# Patient Record
Sex: Male | Born: 2005 | Race: White | Hispanic: Yes | Marital: Single | Smoking: Never smoker
Health system: Southern US, Community
[De-identification: ages and names within clinical notes are randomized; demographics above are authoritative.]

## PROBLEM LIST (undated history)

## (undated) DIAGNOSIS — F4322 Adjustment disorder with anxiety: Principal | ICD-10-CM

## (undated) DIAGNOSIS — R51 Headache: Secondary | ICD-10-CM

## (undated) HISTORY — DX: Headache: R51

## (undated) HISTORY — DX: Adjustment disorder with anxiety: F43.22

---
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
## 2023-02-18 ENCOUNTER — Telehealth: Payer: Self-pay | Admitting: Family

## 2023-02-18 NOTE — Telephone Encounter (Signed)
Called Pt and left a voicemail to remind of scheduled New Patient appt. 

## 2023-02-22 NOTE — Progress Notes (Unsigned)
  Subjective:    Alan Waters - 17 y.o. male MRN [id number]  Date of birth: July 08, 2006  HPI  Alan Waters is to establish care.  Current issues and/or concerns:  ROS per HPI     Health Maintenance:  Health Maintenance Due  Topic Date Due   Hepatitis C Screening  Never done   COVID-19 Vaccine (2 - 2023-24 season) 05/18/2022     Past Medical History: Patient Active Problem List   Diagnosis Date Noted   Keratosis pilaris 08/29/2020   Astigmatism, regular 02/28/2015   Generalized anxiety disorder 11/29/2014   Reading disorder 11/29/2014      Social History   reports that he has never smoked. He has never used smokeless tobacco. He reports that he does not drink alcohol and does not use drugs.   Family History  family history includes Diabetes in his paternal uncle.   Medications: reviewed and updated   Objective:   Physical Exam There were no vitals taken for this visit. Physical Exam      Assessment & Plan:         Patient was given clear instructions to go to Emergency Department or return to medical center if symptoms don't improve, worsen, or new problems develop.The patient verbalized understanding.  I discussed the assessment and treatment plan with the patient. The patient was provided an opportunity to ask questions and all were answered. The patient agreed with the plan and demonstrated an understanding of the instructions.   The patient was advised to call back or seek an in-person evaluation if the symptoms worsen or if the condition fails to improve as anticipated.    Ricky Stabs, NP 02/22/2023, 1:05 PM Primary Care at [facility]

## 2023-02-26 ENCOUNTER — Ambulatory Visit (INDEPENDENT_AMBULATORY_CARE_PROVIDER_SITE_OTHER): Payer: Medicaid Other | Admitting: Family

## 2023-02-26 ENCOUNTER — Encounter: Payer: Self-pay | Admitting: Family

## 2023-02-26 VITALS — BP 110/71 | HR 85 | Temp 98.1°F | Resp 12 | Ht 64.0 in | Wt 127.4 lb

## 2023-02-26 DIAGNOSIS — Z130 Encounter for screening for diseases of the blood and blood-forming organs and certain disorders involving the immune mechanism: Secondary | ICD-10-CM

## 2023-02-26 DIAGNOSIS — Z1329 Encounter for screening for other suspected endocrine disorder: Secondary | ICD-10-CM

## 2023-02-26 DIAGNOSIS — Z13228 Encounter for screening for other metabolic disorders: Secondary | ICD-10-CM

## 2023-02-26 DIAGNOSIS — Z13220 Encounter for screening for lipoid disorders: Secondary | ICD-10-CM

## 2023-02-26 DIAGNOSIS — Z0000 Encounter for general adult medical examination without abnormal findings: Secondary | ICD-10-CM

## 2023-02-26 DIAGNOSIS — Z131 Encounter for screening for diabetes mellitus: Secondary | ICD-10-CM

## 2023-02-26 DIAGNOSIS — Z758 Other problems related to medical facilities and other health care: Secondary | ICD-10-CM

## 2023-02-26 DIAGNOSIS — Z1159 Encounter for screening for other viral diseases: Secondary | ICD-10-CM

## 2023-02-26 DIAGNOSIS — Z7689 Persons encountering health services in other specified circumstances: Secondary | ICD-10-CM

## 2023-02-26 NOTE — Patient Instructions (Signed)
Thank you for choosing Primary Care at [facility] for your medical home!    Alan Waters was seen by Rema Fendt, NP today.   Drexel Iha Rodriguez's primary care provider is Rema Fendt, NP.   For the best care possible,  you should try to see Ricky Stabs, NP whenever you come to office.   We look forward to seeing you again soon!  If you have any questions about your visit today,  please call us at [phone]  Or feel free to reach your provider via MyChart.      Keeping you healthy   Get these tests Blood pressure- Have your blood pressure checked once a year by your healthcare provider.  Normal blood pressure is 120/80. Weight- Have your body mass index (BMI) calculated to screen for obesity.  BMI is a measure of body fat based on height and weight. You can also calculate your own BMI at https://www.west-esparza.com/. Cholesterol- Have your cholesterol checked regularly starting at age 83, sooner may be necessary if you have diabetes, high blood pressure, if a family member developed heart diseases at an early age or if you smoke.  Chlamydia, HIV, and other sexual transmitted disease- Get screened each year until the age of 52 then within three months of each new sexual partner. Diabetes- Have your blood sugar checked regularly if you have high blood pressure, high cholesterol, a family history of diabetes or if you are overweight.   Get these vaccines Flu shot- Every fall. Tetanus shot- Every 10 years. Menactra- Single dose; prevents meningitis.   Take these steps Don't smoke- If you do smoke, ask your healthcare provider about quitting. For tips on how to quit, go to www.smokefree.gov or call 1-800-QUIT-NOW. Be physically active- Exercise 5 days a week for at least 30 minutes.  If you are not already physically active start slow and gradually work up to 30 minutes of moderate physical activity.  Examples of moderate activity include walking briskly, mowing  the yard, dancing, swimming bicycling, etc. Eat a healthy diet- Eat a variety of healthy foods such as fruits, vegetables, low fat milk, low fat cheese, yogurt, lean meats, poultry, fish, beans, tofu, etc.  For more information on healthy eating, go to www.thenutritionsource.org Drink alcohol in moderation- Limit alcohol intake two drinks or less a day.  Never drink and drive. Dentist- Brush and floss teeth twice daily; visit your dentis twice a year. Depression-Your emotional health is as important as your physical health.  If you're feeling down, losing interest in things you normally enjoy please talk with your healthcare provider. Gun Safety- If you keep a gun in your home, keep it unloaded and with the safety lock on.  Bullets should be stored separately. Helmet use- Always wear a helmet when riding a motorcycle, bicycle, rollerblading or skateboarding. Safe sex- If you may be exposed to a sexually transmitted infection, use a condom Seat belts- Seat bels can save your life; always wear one. Smoke/Carbon Monoxide detectors- These detectors need to be installed on the appropriate level of your home.  Replace batteries at least once a year. Skin Cancer- When out in the sun, cover up and use sunscreen SPF 15 or higher. Violence- If anyone is threatening or hurting you, please tell your healthcare provider.

## 2023-02-27 ENCOUNTER — Other Ambulatory Visit: Payer: Self-pay | Admitting: Family

## 2023-02-27 DIAGNOSIS — Z13228 Encounter for screening for other metabolic disorders: Secondary | ICD-10-CM

## 2023-02-27 DIAGNOSIS — R7303 Prediabetes: Secondary | ICD-10-CM | POA: Insufficient documentation

## 2023-02-27 LAB — CBC
Hematocrit: 46.9 % (ref 37.5–51.0)
Hemoglobin: 15.5 g/dL (ref 13.0–17.7)
MCH: 28.5 pg (ref 26.6–33.0)
MCHC: 33 g/dL (ref 31.5–35.7)
MCV: 86 fL (ref 79–97)
Platelets: 261 10*3/uL (ref 150–450)
RBC: 5.43 x10E6/uL (ref 4.14–5.80)
RDW: 13 % (ref 11.6–15.4)
WBC: 5.5 10*3/uL (ref 3.4–10.8)

## 2023-02-27 LAB — CMP14+EGFR
ALT: 50 IU/L — ABNORMAL HIGH (ref 0–44)
AST: 82 IU/L — ABNORMAL HIGH (ref 0–40)
Albumin/Globulin Ratio: 2
Albumin: 5.1 g/dL (ref 4.3–5.2)
Alkaline Phosphatase: 63 IU/L (ref 51–125)
BUN/Creatinine Ratio: 11 (ref 9–20)
BUN: 7 mg/dL (ref 6–20)
Bilirubin Total: 0.7 mg/dL (ref 0.0–1.2)
CO2: 26 mmol/L (ref 20–29)
Calcium: 9.8 mg/dL (ref 8.7–10.2)
Chloride: 99 mmol/L (ref 96–106)
Creatinine, Ser: 0.63 mg/dL — ABNORMAL LOW (ref 0.76–1.27)
Globulin, Total: 2.5 g/dL (ref 1.5–4.5)
Glucose: 77 mg/dL (ref 70–99)
Potassium: 4.4 mmol/L (ref 3.5–5.2)
Sodium: 139 mmol/L (ref 134–144)
Total Protein: 7.6 g/dL (ref 6.0–8.5)
eGFR: 141 mL/min/{1.73_m2} (ref >59)

## 2023-02-27 LAB — TSH: TSH: 0.968 u[IU]/mL (ref 0.450–4.500)

## 2023-02-27 LAB — HEPATITIS C ANTIBODY: Hep C Virus Ab: NONREACTIVE

## 2023-02-27 LAB — LIPID PANEL
Chol/HDL Ratio: 2.9 ratio (ref 0.0–5.0)
Cholesterol, Total: 150 mg/dL (ref 100–169)
HDL: 51 mg/dL (ref >39)
LDL Chol Calc (NIH): 87 mg/dL (ref 0–109)
Triglycerides: 55 mg/dL (ref 0–89)
VLDL Cholesterol Cal: 12 mg/dL (ref 5–40)

## 2023-02-27 LAB — HEMOGLOBIN A1C
Est. average glucose Bld gHb Est-mCnc: 117 mg/dL
Hgb A1c MFr Bld: 5.7 % — ABNORMAL HIGH (ref 4.8–5.6)

## 2023-04-23 ENCOUNTER — Encounter: Payer: Self-pay | Admitting: Family

## 2023-04-23 ENCOUNTER — Ambulatory Visit (INDEPENDENT_AMBULATORY_CARE_PROVIDER_SITE_OTHER): Payer: Medicaid Other | Admitting: Family

## 2023-04-23 VITALS — BP 114/68 | HR 87 | Temp 98.7°F | Ht 64.0 in | Wt 132.8 lb

## 2023-04-23 DIAGNOSIS — Z13228 Encounter for screening for other metabolic disorders: Secondary | ICD-10-CM | POA: Diagnosis not present

## 2023-04-23 NOTE — Progress Notes (Signed)
Patient ID: Alan Waters, male    DOB: 24-Jul-2006  MRN: [id number]  CC: Follow-Up  Subjective: Alan Waters is a 17 y.o. male who presents for follow-up.  His concerns today include:  Recheck liver function. No further issues/concerns for discussion today.   Patient Active Problem List   Diagnosis Date Noted   Prediabetes 02/27/2023   Keratosis pilaris 08/29/2020   Astigmatism, regular 02/28/2015   Generalized anxiety disorder 11/29/2014   Reading disorder 11/29/2014     Current Outpatient Medications on File Prior to Visit  Medication Sig Dispense Refill   Clindamycin-Benzoyl Per, Refr, gel Thin topical layer 1-2 times a day (Patient not taking: Reported on 02/26/2023) 45 g 3   hydrocortisone 2.5 % ointment Apply topically 2 (two) times daily. Apply twice a day to back of arms as needed (Patient not taking: Reported on 02/26/2023) 30 g 3   No current facility-administered medications on file prior to visit.    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 He lives with both parents, his sister, and his two brothers. He enjoys drawing, playing outside, and his phone   Social Determinants of Health   Financial Resource Strain: Not on file  Food Insecurity: Not on file  Transportation Needs: Not on file  Physical Activity: Not on file  Stress: Not on file  Social Connections: Not on file  Intimate  Partner Violence: Not on file    Family History  Problem Relation Age of Onset   Diabetes Paternal Uncle     No past surgical history on file.  ROS: Review of Systems Negative except as stated above  PHYSICAL EXAM: BP 114/68   Pulse 87   Temp 98.7 F (37.1 C) (Oral)   Ht 5\' 4"  (1.626 m)   Wt 132 lb 12.8 oz (60.2 kg)   SpO2 98%   BMI 22.80 kg/m   Physical Exam HENT:     Head: Normocephalic and atraumatic.     Nose: Nose normal.     Mouth/Throat:     Mouth: Mucous membranes are moist.     Pharynx: Oropharynx is clear.  Eyes:     Extraocular Movements: Extraocular movements intact.     Conjunctiva/sclera: Conjunctivae normal.     Pupils: Pupils are equal, round, and reactive to light.  Cardiovascular:     Rate and Rhythm: Normal rate and regular rhythm.     Pulses: Normal pulses.     Heart sounds: Normal heart sounds.  Pulmonary:     Effort: Pulmonary effort is normal.     Breath sounds: Normal breath sounds.  Musculoskeletal:        General: Normal range of motion.     Cervical back: Normal range of motion and neck supple.  Neurological:     General:  No focal deficit present.     Mental Status: He is alert and oriented to person, place, and time.  Psychiatric:        Mood and Affect: Mood normal.        Behavior: Behavior normal.     ASSESSMENT AND PLAN: 1. Screening for metabolic disorder - Routine screening.  - AST - ALT   Patient was given the opportunity to ask questions.  Patient verbalized understanding of the plan and was able to repeat key elements of the plan. Patient was given clear instructions to go to Emergency Department or return to medical center if symptoms don't improve, worsen, or new problems develop.The patient verbalized understanding.   Orders Placed This Encounter  Procedures   AST   ALT    Return in about 4 months (around 08/23/2023) for Follow-Up or next available prediabetes.  Rema Fendt, NP

## 2023-08-23 ENCOUNTER — Ambulatory Visit (INDEPENDENT_AMBULATORY_CARE_PROVIDER_SITE_OTHER): Payer: Medicaid Other | Admitting: Family

## 2023-08-23 ENCOUNTER — Encounter: Payer: Self-pay | Admitting: Family

## 2023-08-23 VITALS — BP 130/77 | HR 87 | Temp 97.5°F | Ht 64.5 in | Wt 138.6 lb

## 2023-08-23 DIAGNOSIS — Z0000 Encounter for general adult medical examination without abnormal findings: Secondary | ICD-10-CM | POA: Diagnosis not present

## 2023-08-23 DIAGNOSIS — Z23 Encounter for immunization: Secondary | ICD-10-CM | POA: Diagnosis not present

## 2023-08-23 NOTE — Progress Notes (Signed)
Patient states no concerns to discuss.   Patient wants meningo b vaccine.

## 2023-08-23 NOTE — Progress Notes (Signed)
Patient ID: Alan Waters, male    DOB: 07-22-2006  MRN: [id number]  CC: Vaccine  Subjective: Alan Waters is a 17 y.o. male who presents for vaccine.   His concerns today include:  No issues/concerns for discussion today.   Patient Active Problem List   Diagnosis Date Noted   Prediabetes 02/27/2023   Keratosis pilaris 08/29/2020   Astigmatism, regular 02/28/2015   Generalized anxiety disorder 11/29/2014   Reading disorder 11/29/2014     No current outpatient medications on file prior to visit.   No current facility-administered medications on file prior to visit.    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 He lives with both parents, his sister, and his two brothers. He enjoys drawing, playing outside, and his phone   Social Determinants of Health   Financial Resource Strain: Not on file  Food Insecurity: Not on file  Transportation Needs: Not on file  Physical Activity: Not on file  Stress: Not on file  Social Connections: Not on file  Intimate Partner Violence: Not on file    Family History  Problem Relation Age of Onset   Diabetes Paternal Uncle     No past surgical history on file.  ROS: Review of Systems Negative except as stated above  PHYSICAL EXAM: BP 130/77   Pulse 87   Temp (!) 97.5 F (36.4 C) (Oral)   Ht 5' 4.5" (1.638 m)   Wt 138 lb 9.6 oz (62.9 kg)   SpO2 98%    BMI 23.42 kg/m   Physical Exam HENT:     Head: Normocephalic and atraumatic.     Nose: Nose normal.     Mouth/Throat:     Mouth: Mucous membranes are moist.     Pharynx: Oropharynx is clear.  Eyes:     Extraocular Movements: Extraocular movements intact.     Conjunctiva/sclera: Conjunctivae normal.     Pupils: Pupils are equal, round, and reactive to light.  Cardiovascular:     Rate and Rhythm: Normal rate and regular rhythm.     Pulses: Normal pulses.     Heart sounds: Normal heart sounds.  Pulmonary:     Effort: Pulmonary effort is normal.     Breath sounds: Normal breath sounds.  Musculoskeletal:        General: Normal range of motion.     Cervical back: Normal range of motion and neck supple.  Neurological:     General: No focal deficit present.     Mental Status: He is alert and oriented to person, place, and time.  Psychiatric:        Mood and Affect: Mood normal.        Behavior: Behavior normal.     ASSESSMENT AND PLAN: 1. Immunization due - Administered.  - Meningococcal  B, OMV   Patient was given the opportunity to ask questions.  Patient verbalized understanding of the plan and was able to repeat key elements of the plan. Patient was given clear instructions to go to Emergency Department or return to medical center if symptoms don't improve, worsen, or new problems develop.The patient verbalized understanding.   Orders Placed This Encounter  Procedures   Meningococcal B, OMV    Follow-up with primary provider as scheduled.   Rema Fendt, NP

## 2023-12-16 ENCOUNTER — Other Ambulatory Visit: Payer: Self-pay

## 2023-12-16 ENCOUNTER — Ambulatory Visit
Admission: EM | Admit: 2023-12-16 | Discharge: 2023-12-16 | Disposition: A | Discharge status: Home / Self Care | Attending: Emergency Medicine | Admitting: Emergency Medicine

## 2023-12-16 ENCOUNTER — Encounter: Payer: Self-pay | Admitting: *Deleted

## 2023-12-16 DIAGNOSIS — R112 Nausea with vomiting, unspecified: Secondary | ICD-10-CM | POA: Insufficient documentation

## 2023-12-16 LAB — POCT INFLUENZA A/B
Influenza A, POC: NEGATIVE
Influenza B, POC: NEGATIVE

## 2023-12-16 MED ORDER — ONDANSETRON HCL 4 MG PO TABS: 4.0000 mg | ORAL_TABLET | Freq: Three times a day (TID) | ORAL | 0 refills | Status: DC | PRN

## 2023-12-16 MED ORDER — ONDANSETRON 4 MG PO TBDP
4.0000 mg | ORAL_TABLET | Freq: Once | ORAL | Status: AC
  Administered 2023-12-16: 4 mg via ORAL

## 2023-12-16 NOTE — Discharge Instructions (Addendum)
 Most likely this is a viral illness and will need to run its course , no antibitoics are indicated.  Your flu test was negative , COVID is pending , check MyChart for results  Rest, push fluids, take Zofran as directed If you are unable to keep any fluids down, or have worsening symptoms go to the emergency room for further evaluation

## 2023-12-16 NOTE — ED Provider Notes (Addendum)
 [facility]    CSN: [id number] Arrival date & time: 12/16/23  1302      History   Chief Complaint Chief Complaint  Patient presents with   Emesis    HPI Alan Waters is a 18 y.o. male.   18 year old male pt, Alan Waters, Alan Waters to urgent care for evaluation of vomiting that started this morning.  Patient states he has vomited 3 times, no diarrhea, patient states he tried to eat some water and eggs and vomited 30 minutes after.  No known illness exposure.   The history is provided by the patient. No language interpreter was used.    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     Headache(784.0)    Migraine variant 08/03/2013    Patient Active Problem List   Diagnosis Date Noted   Prediabetes 02/27/2023   Keratosis pilaris 08/29/2020   Astigmatism, regular 02/28/2015   Generalized anxiety disorder 11/29/2014   Reading disorder 11/29/2014   Nausea and vomiting 08/03/2013    History reviewed. No pertinent surgical history.     Home Medications    Prior to Admission medications   Medication Sig Start Date End Date Taking? Authorizing Provider  ondansetron (ZOFRAN) 4 MG tablet Take 1 tablet (4 mg total) by mouth every 8 (eight) hours as needed for up to 3 days for nausea or vomiting. 12/16/23 12/19/23 Yes Trea Latner, Para March, NP    Family History Family History  Problem Relation Age of Onset   Diabetes Paternal Uncle     Social History Social History   Tobacco Use   Smoking status: Never   Smokeless tobacco: Never  Substance Use Topics   Alcohol use: No    Alcohol/week: 0.0 standard drinks of alcohol   Drug use: No     Allergies   Patient has no known allergies.   Review of Systems Review of Systems  Constitutional:  Negative for fever.  Gastrointestinal:  Positive for  abdominal pain, nausea and vomiting. Negative for diarrhea.  All other systems reviewed and are negative.    Physical Exam Triage Vital Signs ED Triage Vitals  Encounter Vitals Group     BP      Systolic BP Percentile      Diastolic BP Percentile      Pulse      Resp      Temp      Temp src      SpO2      Weight      Height      Head Circumference      Peak Flow      Pain Score      Pain Loc      Pain Education      Exclude from Growth Chart    No data found.  Updated Vital Signs BP 122/63 (BP Location: Left Arm)   Pulse (!) 114   Temp 99.2 F (37.3 C) (Oral)   Resp 18   SpO2 97%   Visual Acuity Right Eye Distance:   Left Eye Distance:   Bilateral Distance:    Right Eye Near:   Left Eye Near:    Bilateral Near:     Physical Exam Vitals and nursing note reviewed.  Constitutional:      General: He is not in acute distress.    Appearance: He  is well-developed and well-groomed.  HENT:     Head: Normocephalic and atraumatic.  Eyes:     Conjunctiva/sclera: Conjunctivae normal.  Cardiovascular:     Rate and Rhythm: Regular rhythm. Tachycardia present.     Heart sounds: Normal heart sounds. No murmur heard. Pulmonary:     Effort: Pulmonary effort is normal. No respiratory distress.     Breath sounds: Normal breath sounds and air entry.  Abdominal:     General: Bowel sounds are increased.     Palpations: Abdomen is soft.     Tenderness: There is no abdominal tenderness. There is no guarding or rebound.  Musculoskeletal:        General: No swelling.     Cervical back: Neck supple.  Skin:    General: Skin is warm and dry.     Capillary Refill: Capillary refill takes less than 2 seconds.  Neurological:     General: No focal deficit present.     Mental Status: He is alert and oriented to person, place, and time.     GCS: GCS eye subscore is 4. GCS verbal subscore is 5. GCS motor subscore is 6.     Cranial Nerves: No cranial nerve deficit.     Sensory: No  sensory deficit.  Psychiatric:        Attention and Perception: Attention normal.        Mood and Affect: Mood normal.        Speech: Speech normal.        Behavior: Behavior normal. Behavior is cooperative.      UC Treatments / Results  Labs (all labs ordered are listed, but only abnormal results are displayed) Labs Reviewed  POCT INFLUENZA A/B - Normal  SARS CORONAVIRUS 2 (TAT 6-24 HRS)    EKG   Radiology No results found.  Procedures Procedures (including critical care time)  Medications Ordered in UC Medications  ondansetron (ZOFRAN-ODT) disintegrating tablet 4 mg (4 mg Oral Given 12/16/23 1547)    Initial Impression / Assessment and Plan / UC Course  I have reviewed the triage vital signs and the nursing notes.  Pertinent labs & imaging results that were available during my care of the patient were reviewed by me and considered in my medical decision making (see chart for details).    Discussed exam findings and plan of care with patient, clear liquid diet , Zofran scripted ,strict go to ER precautions given.   Patient verbalized understanding to this provider.  Ddx: Gastroenteritis, nausea, vomiting, viral illness Final Clinical Impressions(s) / UC Diagnoses   Final diagnoses:  Nausea and vomiting, unspecified vomiting type     Discharge Instructions      Most likely this is a viral illness and will need to run its course , no antibitoics are indicated.  Your flu test was negative , COVID is pending , check MyChart for results  Rest, push fluids, take Zofran as directed If you are unable to keep any fluids down, or have worsening symptoms go to the emergency room for further evaluation     ED Prescriptions     Medication Sig Dispense Auth. Provider   ondansetron (ZOFRAN) 4 MG tablet Take 1 tablet (4 mg total) by mouth every 8 (eight) hours as needed for up to 3 days for nausea or vomiting. 9 tablet Shamarr Faucett, Para March, NP      PDMP not reviewed  this encounter.   Clancy Gourd, NP 12/16/23 1702    Clancy Gourd, NP 12/16/23 [phone]

## 2023-12-16 NOTE — ED Triage Notes (Signed)
 Pt reports vomiting x 3 today, last at 1200. Denies diarrhea, endorses nausea

## 2023-12-16 NOTE — ED Notes (Signed)
 Pt tolerating po fluids. Reports nausea has improved

## 2023-12-17 ENCOUNTER — Ambulatory Visit: Attending: Nurse Practitioner | Admitting: Nurse Practitioner

## 2023-12-17 ENCOUNTER — Encounter: Payer: Self-pay | Admitting: Nurse Practitioner

## 2023-12-17 ENCOUNTER — Ambulatory Visit: Payer: Self-pay

## 2023-12-17 VITALS — BP 107/74 | HR 110 | Temp 98.5°F | Resp 18 | Ht 64.5 in | Wt 145.2 lb

## 2023-12-17 DIAGNOSIS — B349 Viral infection, unspecified: Secondary | ICD-10-CM | POA: Diagnosis not present

## 2023-12-17 LAB — SARS CORONAVIRUS 2 (TAT 6-24 HRS): SARS Coronavirus 2: NEGATIVE

## 2023-12-17 MED ORDER — IBUPROFEN 600 MG PO TABS: 600.0000 mg | ORAL_TABLET | Freq: Three times a day (TID) | ORAL | 0 refills | Status: AC | PRN

## 2023-12-17 MED ORDER — ONDANSETRON 4 MG PO TBDP: 4.0000 mg | ORAL_TABLET | Freq: Three times a day (TID) | ORAL | 0 refills | Status: AC | PRN

## 2023-12-17 NOTE — Telephone Encounter (Addendum)
  Chief Complaint: vomiting Symptoms: headac, eye pain Frequency: yesterday  Pertinent Negatives: Patient denies fever, stiff neck, sore throat, cold sx Disposition: [] ED /[] Urgent Care (no appt availability in office) / [x] Appointment(In office/virtual)/ []  [facility]/ [] Home Care/ [] Refused Recommended Disposition /[] [facility]/ []  Follow-up with PCP Additional Notes: assisted by Spanish translator Morrie Sheldon [phone] Copied from CRM [id number]. Topic: Clinical - Red Word Triage >> Dec 17, 2023  8:17 AM Fredrich Romans wrote: Red Word that prompted transfer to Nurse Triage: vomiting,headache Reason for Disposition  [1] MODERATE headache (e.g., interferes with normal activities) AND [2] present > 24 hours AND [3] unexplained  (Exceptions: analgesics not tried, typical migraine, or headache part of viral illness)  Answer Assessment - Initial Assessment Questions 1. LOCATION: "Where does it hurt?"      Top of head  2. ONSET: "When did the headache start?" (Minutes, hours or days)      Yesterday  3. PATTERN: "Does the pain come and go, or has it been constant since it started?"    Headache constant 4. SEVERITY: "How bad is the pain?" and "What does it keep you from doing?"  (e.g., Scale 1-10; mild, moderate, or severe)   - MILD (1-3): doesn't interfere with normal activities    - MODERATE (4-7): interferes with normal activities or awakens from sleep    - SEVERE (8-10): excruciating pain, unable to do any normal activities        7/10 9. OTHER SYMPTOMS: "Do you have any other symptoms?" (fever, stiff neck, eye pain, sore throat, cold symptoms)     Vomiting 6 yesterday, eyes hurt  Protocols used: Headache-A-AH

## 2023-12-17 NOTE — Progress Notes (Signed)
 Assessment & Plan:   Alan Waters was seen today for headache and generalized body aches.  Diagnoses and all orders for this visit:  Viral illness Headache likely due to dehydration. Increase fluid intake: gatorade, pedialyte popsicles, hydration drinks, water, broth -     ondansetron (ZOFRAN-ODT) 4 MG disintegrating tablet; Take 1 tablet (4 mg total) by mouth every 8 (eight) hours as needed for nausea or vomiting. -     ibuprofen (ADVIL) 600 MG tablet; Take 1 tablet (600 mg total) by mouth every 8 (eight) hours as needed.    Patient has been counseled on age-appropriate routine health concerns for screening and prevention. These are reviewed and up-to-date. Referrals have been placed accordingly. Immunizations are up-to-date or declined.    Subjective:   Chief Complaint  Patient presents with   Headache   Generalized Body Aches    Alan Waters 18 y.o. male presents to office today for viral illness  He is accompanied by his grandmother today and is a patient of Ricky Stabs NP.   He was treated at the urgent care yesterday for vomiting with zofran. Today he states the frequency of vomiting has decreased however his body is sore and he is experiencing a headache. Denies fever, neck pain or change in vision.     Review of Systems  Constitutional:  Negative for fever, malaise/fatigue and weight loss.  HENT: Negative.  Negative for nosebleeds.   Eyes: Negative.  Negative for blurred vision, double vision and photophobia.  Respiratory: Negative.  Negative for cough and shortness of breath.   Cardiovascular: Negative.  Negative for chest pain, palpitations and leg swelling.  Gastrointestinal: Negative.  Negative for heartburn, nausea and vomiting.  Musculoskeletal:  Positive for myalgias.  Neurological:  Positive for headaches. Negative for dizziness, focal weakness and seizures.  Psychiatric/Behavioral: Negative.  Negative for suicidal ideas.     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     Headache(784.0)    Migraine variant 08/03/2013    No past surgical history on file.  Family History  Problem Relation Age of Onset   Diabetes Paternal Uncle     Social History Reviewed with no changes to be made today.   Outpatient Medications Prior to Visit  Medication Sig Dispense Refill   ondansetron (ZOFRAN) 4 MG tablet Take 1 tablet (4 mg total) by mouth every 8 (eight) hours as needed for up to 3 days for nausea or vomiting. 9 tablet 0   No facility-administered medications prior to visit.    No Known Allergies     Objective:    BP 107/74 (BP Location: Left Arm, Patient Position: Sitting, Cuff Size: Normal)   Pulse (!) 110   Temp 98.5 F (36.9 C) (Oral)   Resp 18   Ht 5' 4.5" (1.638 m)   Wt 145 lb 3.2 oz (65.9 kg)   SpO2 100%   BMI 24.54 kg/m  Wt Readings from Last 3 Encounters:  12/17/23 145 lb 3.2 oz (65.9 kg) (33%, Z= -0.44)*  08/23/23 138 lb 9.6 oz (62.9 kg) (24%, Z= -0.72)*  04/23/23 132 lb 12.8 oz (60.2 kg) (16%, Z= -0.98)*   * Growth percentiles are based on CDC (Boys, 2-20 Years) data.    Physical Exam Vitals and nursing note reviewed.  Constitutional:      Appearance: He is well-developed.  HENT:  Head: Normocephalic and atraumatic.  Cardiovascular:     Rate and Rhythm: Normal rate and regular rhythm.     Heart sounds: Normal heart sounds. No murmur heard.    No friction rub. No gallop.  Pulmonary:     Effort: Pulmonary effort is normal. No tachypnea or respiratory distress.     Breath sounds: Normal breath sounds. No decreased breath sounds, wheezing, rhonchi or rales.  Chest:     Chest wall: No tenderness.  Abdominal:     General: Bowel sounds are normal.     Palpations: Abdomen is soft.  Musculoskeletal:        General: Normal range of motion.      Cervical back: Normal range of motion.  Skin:    General: Skin is warm and dry.  Neurological:     Mental Status: He is alert and oriented to person, place, and time.     Coordination: Coordination normal.  Psychiatric:        Behavior: Behavior normal. Behavior is cooperative.        Thought Content: Thought content normal.        Judgment: Judgment normal.          Patient has been counseled extensively about nutrition and exercise as well as the importance of adherence with medications and regular follow-up. The patient was given clear instructions to go to ER or return to medical center if symptoms don't improve, worsen or new problems develop. The patient verbalized understanding.   Follow-up: Return if symptoms worsen or fail to improve.   Claiborne Rigg, FNP-BC [facility] [place], [place] [phone]   12/17/2023, 2:22 PM

## 2023-12-17 NOTE — Telephone Encounter (Signed)
 Patient had appointment with Zelda.
